# Patient Record
Sex: Female | Born: 1955
Health system: Southern US, Community
[De-identification: ages and names within clinical notes are randomized; demographics above are authoritative.]

## PROBLEM LIST (undated history)

## (undated) HISTORY — PX: ABDOMINAL HYSTERECTOMY: SHX81

---

## 2006-09-01 ENCOUNTER — Ambulatory Visit (HOSPITAL_COMMUNITY): Admission: RE | Admit: 2006-09-01 | Discharge: 2006-09-01 | Payer: Self-pay | Admitting: Family Medicine

## 2008-09-28 ENCOUNTER — Ambulatory Visit (HOSPITAL_COMMUNITY): Admission: RE | Admit: 2008-09-28 | Discharge: 2008-09-28 | Payer: Self-pay | Admitting: Family Medicine

## 2015-12-27 ENCOUNTER — Encounter: Payer: Self-pay | Admitting: Podiatry

## 2015-12-27 ENCOUNTER — Ambulatory Visit (INDEPENDENT_AMBULATORY_CARE_PROVIDER_SITE_OTHER): Payer: BLUE CROSS/BLUE SHIELD | Admitting: Podiatry

## 2015-12-27 VITALS — BP 131/70 | HR 89 | Resp 12

## 2015-12-27 DIAGNOSIS — L03032 Cellulitis of left toe: Secondary | ICD-10-CM | POA: Diagnosis not present

## 2015-12-27 MED ORDER — CEPHALEXIN 500 MG PO CAPS: 500.0000 mg | ORAL_CAPSULE | Freq: Four times a day (QID) | ORAL | Status: DC

## 2015-12-27 NOTE — Progress Notes (Signed)
Subjective:     Patient ID: Anna Sawyer, female   DOB: [birth year], 60 y.o.   MRN: [id number]  HPI this patient presents to the office with chief complaint of a painful big toenail, left foot. She says her toenail has been painful for the last 3 weeks it has become red, swollen and inflamed at the cuticle of her left big toe. She has attempted to soak it in Epsom salts, but no improvement was noted. She presents the office today for an evaluation and treatment of this condition Review of Systems     Objective:   Physical Exam GENERAL APPEARANCE: Alert, conversant. Appropriately groomed. No acute distress.  VASCULAR: Pedal pulses palpable at  [facility] and PT bilateral.  Capillary refill time is immediate to all digits,  Normal temperature gradient.  Digital hair growth is present bilateral  NEUROLOGIC: sensation is normal to 5.07 monofilament at 5/5 sites bilateral.  Light touch is intact bilateral, Muscle strength normal.  MUSCULOSKELETAL: acceptable muscle strength, tone and stability bilateral.  Intrinsic muscluature intact bilateral.  Rectus appearance of foot and digits noted bilateral.   DERMATOLOGIC: skin color, texture, and turgor are within normal limits.  No preulcerative lesions or ulcers  are seen, no interdigital maceration noted.  No open lesions present.  . No drainage noted.  NAILS  Redness and swelling and infection proximal nail fold left hallux.  Her toenail has evidence of injury to the proximal aspect of her left hallux nail. No abscess or fluctuance notefd.      Assessment:     Paronychia left hallux.     Plan:     IE  After evaluation of her hallux we decided to treat her conservatively with antibiotics and soaks.  If problem continues or worsens we will need to avulse the nail.  Prescribe cephalexin # 28 and perform home soaks.  RTC 1 week.    Gardiner Barefoot DPM

## 2016-01-03 ENCOUNTER — Encounter: Payer: Self-pay | Admitting: Podiatry

## 2016-01-03 ENCOUNTER — Ambulatory Visit (INDEPENDENT_AMBULATORY_CARE_PROVIDER_SITE_OTHER): Payer: BLUE CROSS/BLUE SHIELD | Admitting: Podiatry

## 2016-01-03 VITALS — BP 126/79 | HR 76 | Resp 14

## 2016-01-03 DIAGNOSIS — L03032 Cellulitis of left toe: Secondary | ICD-10-CM | POA: Diagnosis not present

## 2016-01-03 NOTE — Progress Notes (Signed)
Subjective:     Patient ID: Anna Sawyer, female   DOB: 25-Feb-1956, 60 y.o.   MRN: [id number]  HPI This patient returns to the office with diagnosis of paronychia left hallux.  There is persistant redness at proximal nail fold with mild discomfort.  She says her toe initially improved but then the redness returned.  She presents for continued evaluation and treatment.   Review of Systems     Objective:   Physical Examneurovascular status same as 12/27/15.  There is redness ap proximal nail fold left hallux.  The redness has lessened and the swelling has resolved.     Assessment:     Paronychia left hallux.     Plan:     Incision and Drainage right hallux toenail under local anesthesia with left hallux toenail avulsion.  Neosporin/DSD.  Give home instructions.  RTC 1 week. Nail to be sent to pathology.   Gardiner Barefoot DPM

## 2016-01-10 ENCOUNTER — Ambulatory Visit (INDEPENDENT_AMBULATORY_CARE_PROVIDER_SITE_OTHER): Payer: BLUE CROSS/BLUE SHIELD | Admitting: Podiatry

## 2016-01-10 ENCOUNTER — Encounter: Payer: Self-pay | Admitting: Podiatry

## 2016-01-10 VITALS — BP 128/70 | HR 74 | Resp 14

## 2016-01-10 DIAGNOSIS — Z09 Encounter for follow-up examination after completed treatment for conditions other than malignant neoplasm: Secondary | ICD-10-CM

## 2016-01-11 NOTE — Progress Notes (Signed)
Subjective:     Patient ID: RODNEISHA TAWATER, female   DOB: 1956-03-15, 60 y.o.   MRN: [id number]  HPIThis patient returns to the office following nail excision great toe left foot. She has history of infection at proximal nail fold.which was treated unsuccessfully with antibiotics.  She returns to the office today saying the toe is not painful but redness peisists at the cuticle.  She returns for continued evaluation and treatment.   Review of Systems     Objective:   Physical Exam GENERAL APPEARANCE: Alert, conversant. Appropriately groomed. No acute distress.  VASCULAR: Pedal pulses palpable at  [facility] and PT bilateral.  Capillary refill time is immediate to all digits,  Normal temperature gradient.  Digital hair growth is present bilateral  NEUROLOGIC: sensation is normal to 5.07 monofilament at 5/5 sites bilateral.  Light touch is intact bilateral, Muscle strength normal.  MUSCULOSKELETAL: acceptable muscle strength, tone and stability bilateral.  Intrinsic muscluature intact bilateral.  Rectus appearance of foot and digits noted bilateral.   DERMATOLOGIC: skin color, texture, and turgor are within normal limits.  No preulcerative lesions or ulcers  are seen, no interdigital maceration noted.  No open lesions present.  . No drainage noted. NAIL  Left hallyx nail bed has two ulcerated areas on proximal nail bed.  Persistant redness at proximal nail fold.  Redness is increased blood but not an infection.  No drainage from surgical site.      Assessment:     S/p nail surgery     Plan:     ROV.  Healing at surgical site.  We are concerned the nail has not healed and the patient was offered antibiotics.  She will call for prescription if needed.   Gardiner Barefoot DPM

## 2016-01-28 ENCOUNTER — Telehealth: Payer: Self-pay | Admitting: *Deleted

## 2016-01-28 NOTE — Telephone Encounter (Signed)
Pt states she had an ingrown removed and Dr. Prudence Davidson said if it worsened to call for an antibiotic.  Pt described the toe as painful, with swelling and stiffness in the 1st joint.  I instructed pt to begin epsom salt soaks and light antibiotic dressing until seen, and to make an appt.  Pt agreed and I transferred to schedulers.

## 2016-01-30 ENCOUNTER — Ambulatory Visit (INDEPENDENT_AMBULATORY_CARE_PROVIDER_SITE_OTHER): Payer: BLUE CROSS/BLUE SHIELD | Admitting: Podiatry

## 2016-01-30 ENCOUNTER — Encounter: Payer: Self-pay | Admitting: Podiatry

## 2016-01-30 VITALS — BP 125/78 | HR 69 | Resp 14

## 2016-01-30 DIAGNOSIS — Z09 Encounter for follow-up examination after completed treatment for conditions other than malignant neoplasm: Secondary | ICD-10-CM

## 2016-01-30 MED ORDER — DOXYCYCLINE HYCLATE 100 MG PO TABS: 100.0000 mg | ORAL_TABLET | Freq: Two times a day (BID) | ORAL | Status: DC

## 2016-01-30 NOTE — Progress Notes (Signed)
Subjective:     Patient ID: NIMAH ILL, female   DOB: [birth year], 60 y.o.   MRN: [id number]  HPIThis patient returns to the office following nail excision great toe left foot. She has history of infection at proximal nail fold.which was treated unsuccessfully with antibiotics.  She returns to the office today saying the toe is not painful but redness peisists at the cuticle.  She returns to the office with redness worsening with no swelling.  She is concerned an infection may be developing.   Review of Systems     Objective:   Physical Exam GENERAL APPEARANCE: Alert, conversant. Appropriately groomed. No acute distress.  VASCULAR: Pedal pulses palpable at  [facility] and PT bilateral.  Capillary refill time is immediate to all digits,  Normal temperature gradient.  Digital hair growth is present bilateral  NEUROLOGIC: sensation is normal to 5.07 monofilament at 5/5 sites bilateral.  Light touch is intact bilateral, Muscle strength normal.  MUSCULOSKELETAL: acceptable muscle strength, tone and stability bilateral.  Intrinsic muscluature intact bilateral.  Rectus appearance of foot and digits noted bilateral.   DERMATOLOGIC: skin color, texture, and turgor are within normal limits.  No preulcerative lesions or ulcers  are seen, no interdigital maceration noted.  No open lesions present.  . No drainage noted. NAIL  Left hallux nail bed has two ulcerated areas on proximal nail bed.  Persistant redness at proximal nail fold.  Redness is increased blood but not an infection.  No drainage from surgical site.      Assessment:     S/p nail surgery     Plan:     ROV.  Healing at surgical site.  We are concerned the nail has not healed and the patient prescribed doxycycline.     Gardiner Barefoot DPM

## 2016-02-13 ENCOUNTER — Other Ambulatory Visit: Payer: Self-pay | Admitting: *Deleted

## 2019-08-30 ENCOUNTER — Encounter (INDEPENDENT_AMBULATORY_CARE_PROVIDER_SITE_OTHER): Payer: Self-pay | Admitting: *Deleted

## 2021-04-01 ENCOUNTER — Other Ambulatory Visit (HOSPITAL_COMMUNITY): Payer: Self-pay | Admitting: Family Medicine

## 2021-04-01 DIAGNOSIS — Z122 Encounter for screening for malignant neoplasm of respiratory organs: Secondary | ICD-10-CM

## 2021-04-01 DIAGNOSIS — Z1231 Encounter for screening mammogram for malignant neoplasm of breast: Secondary | ICD-10-CM

## 2021-04-08 ENCOUNTER — Other Ambulatory Visit: Payer: Self-pay

## 2021-04-08 ENCOUNTER — Ambulatory Visit (HOSPITAL_COMMUNITY)
Admission: RE | Admit: 2021-04-08 | Discharge: 2021-04-08 | Disposition: A | Discharge status: Home / Self Care | Payer: Medicare Other | Source: Ambulatory Visit | Attending: Family Medicine | Admitting: Family Medicine

## 2021-04-08 DIAGNOSIS — Z1231 Encounter for screening mammogram for malignant neoplasm of breast: Secondary | ICD-10-CM | POA: Diagnosis present

## 2021-05-02 ENCOUNTER — Encounter: Payer: Self-pay | Admitting: *Deleted

## 2021-05-14 ENCOUNTER — Other Ambulatory Visit (HOSPITAL_COMMUNITY): Payer: Self-pay | Admitting: Family Medicine

## 2021-05-14 DIAGNOSIS — Z87891 Personal history of nicotine dependence: Secondary | ICD-10-CM

## 2021-05-14 DIAGNOSIS — Z122 Encounter for screening for malignant neoplasm of respiratory organs: Secondary | ICD-10-CM

## 2021-05-28 ENCOUNTER — Ambulatory Visit: Payer: Medicare Other

## 2021-06-03 ENCOUNTER — Ambulatory Visit (HOSPITAL_COMMUNITY)
Admission: RE | Admit: 2021-06-03 | Discharge: 2021-06-03 | Disposition: A | Discharge status: Home / Self Care | Payer: Medicare Other | Source: Ambulatory Visit | Attending: Family Medicine | Admitting: Family Medicine

## 2021-06-03 ENCOUNTER — Other Ambulatory Visit: Payer: Self-pay

## 2021-06-03 DIAGNOSIS — Z87891 Personal history of nicotine dependence: Secondary | ICD-10-CM | POA: Diagnosis present

## 2021-06-03 DIAGNOSIS — Z122 Encounter for screening for malignant neoplasm of respiratory organs: Secondary | ICD-10-CM | POA: Insufficient documentation

## 2021-06-10 HISTORY — PX: COLONOSCOPY: SHX174

## 2021-07-02 ENCOUNTER — Encounter: Payer: Self-pay | Admitting: Internal Medicine

## 2021-10-27 NOTE — Progress Notes (Signed)
Referring Provider: Dr. Adelina Mings Primary Care Physician:  Celene Squibb, MD Primary Gastroenterologist:  Dr. Abbey Chatters  Chief Complaint  Patient presents with   Colonoscopy    Never had done prior. Attempted to in [place]. Mother hx colon ca, siblings hx colon cancer    HPI:   Anna Sawyer is a 65 y.o. female presenting today at the request of Dr. Adelina Mings for colon adenoma.  Patient underwent colonoscopy with Dr. Adelina Mings on 06/10/2021 which revealed mild internal hemorrhoid, 8 mm polyp in the transverse colon removed with cold snare, 8 mm polyp at splenic flexure removed with hot snare and cold forceps.  States patient appeared to vomit at this point and the entire polyp may not have been resected, scope rapidly retrieved from splenic flexure so left-sided polyps may have been missed, diverticulosis in the left colon.  Both polyps removed were sessile serrated adenomas.  Recommended referral to GI for repeat endoscopy as colonoscopy was terminated prior to completion due to concern for potential emesis.  Today, patient reports she is doing well.  She is not having any GI problems.  Denies abdominal pain, change in bowel habits, BRBPR, melena, unintentional weight loss, nausea, vomiting, reflux symptoms, dysphagia.  She was not aware of any problems intraoperatively during her last colonoscopy.  She does state recover, her BP dropped.  History reviewed. No pertinent past medical history.  Past Surgical History:  Procedure Laterality Date   ABDOMINAL HYSTERECTOMY     COLONOSCOPY  06/10/2021   Dr. Rocco Serene; Mild internal hemorrhoid, 8 mm sessile serrated adenoma in transverse colon resected, 8 mm sessile serrated adenoma in the splenic flexure removed, unclear if entire polyp was removed as patient appeared to vomit and procedure was terminated, due to rapidly retrieving scope from splenic flexure, left-sided polyps may have been missed, diverticulosis in left colon.    No  current outpatient medications on file.   No current facility-administered medications for this visit.    Allergies as of 10/28/2021 - Review Complete 10/28/2021  Allergen Reaction Noted   Codeine  12/27/2015   Elemental sulfur Rash 12/27/2015    Family History  Problem Relation Age of Onset   Colon cancer Mother        late 28s   Colon polyps Sister    Colon polyps Sister    Colon polyps Brother    Colon polyps Brother    Colon polyps Brother     Social History   Socioeconomic History   Marital status: Married    Spouse name: Not on file   Number of children: Not on file   Years of education: Not on file   Highest education level: Not on file  Occupational History   Not on file  Tobacco Use   Smoking status: Former    Types: Cigarettes   Smokeless tobacco: Never  Substance and Sexual Activity   Alcohol use: No    Alcohol/week: 0.0 standard drinks   Drug use: No   Sexual activity: Not on file  Other Topics Concern   Not on file  Social History Narrative   Not on file   Social Determinants of Health   Financial Resource Strain: Not on file  Food Insecurity: Not on file  Transportation Needs: Not on file  Physical Activity: Not on file  Stress: Not on file  Social Connections: Not on file  Intimate Partner Violence: Not on file    Review of Systems: Gen: Denies any fever, chills, cold  or flulike symptoms, presyncope, syncope. CV: Denies chest pain.occasionally feels her heart may skip a beat, but nothing persistent.    Resp: Denies shortness of breath or cough. GI: See HPI GU : Denies urinary burning, urinary frequency, urinary hesitancy MS: Denies joint pain Derm: Denies rash Psych: Denies depression, anxiety Heme: See HPI  Physical Exam: BP (!) 161/81   Pulse 65   Temp (!) 97.1 F (36.2 C)   Ht 5\' 5"  (1.651 m)   Wt 160 lb 12.8 oz (72.9 kg)   BMI 26.76 kg/m  General:   Alert and oriented. Pleasant and cooperative. Well-nourished and  well-developed.  Head:  Normocephalic and atraumatic. Eyes:  Without icterus, sclera clear and conjunctiva pink.  Ears:  Normal auditory acuity. Lungs:  Clear to auscultation bilaterally. No wheezes, rales, or rhonchi. No distress.  Heart:  S1, S2 present without murmurs appreciated.  Abdomen:  +BS, soft, non-tender and non-distended. No HSM noted. No guarding or rebound. No masses appreciated.  Rectal:  Deferred  Msk:  Symmetrical without gross deformities. Normal posture. Extremities:  Without edema. Neurologic:  Alert and  oriented x4;  grossly normal neurologically. Skin:  Intact without significant lesions or rashes. Psych:  Normal mood and affect.    Assessment: 65 year old female with history of colon polyps, presenting today to discuss scheduling colonoscopy due to recent incomplete colonoscopy in July 2022 performed by Dr. Rocco Serene.  She was found to have mild internal hemorrhoids, 8 mm sessile serrated adenoma in the transverse colon resected, 8 mm sessile serrated adenoma at the splenic flexure removed, but unclear if polyp was completely resected as patient appeared to vomit during polypectomy and procedure was terminated.  States scope was rapidly retrieved from splenic flexure so left-sided polyps may have also been missed.  She was referred to GI for complete colonoscopy.  She has no significant upper or lower GI symptoms.  No alarm symptoms.  Family history significant for mother with colon cancer in her late 67s and 5 siblings with colon polyps.   Plan: I will discuss her prior anesthesia complications with Dr. Abbey Chatters.  Do not anticipate any issues with getting her scheduled for repeat colonoscopy. Follow-up PRN    Nevin Bloodgood [facility] 10/28/2021

## 2021-10-28 ENCOUNTER — Encounter: Payer: Self-pay | Admitting: Gastroenterology

## 2021-10-28 ENCOUNTER — Other Ambulatory Visit: Payer: Self-pay

## 2021-10-28 ENCOUNTER — Ambulatory Visit (INDEPENDENT_AMBULATORY_CARE_PROVIDER_SITE_OTHER): Payer: Medicare Other | Admitting: Gastroenterology

## 2021-10-28 VITALS — BP 161/81 | HR 65 | Temp 97.1°F | Ht 65.0 in | Wt 160.8 lb

## 2021-10-28 DIAGNOSIS — Z86010 Personal history of colonic polyps: Secondary | ICD-10-CM | POA: Diagnosis not present

## 2021-10-28 DIAGNOSIS — T8859XA Other complications of anesthesia, initial encounter: Secondary | ICD-10-CM

## 2021-10-28 NOTE — Patient Instructions (Signed)
I am going to discuss with your prior anesthesia complications with Dr. Abbey Chatters prior to scheduling your colonoscopy.  You should hear back from our office early next week.  If not, please reach back out to [place].  It was great meeting you today!  I hope you have a very Merry Christmas!  Aliene Altes, PA-C [facility]

## 2021-10-30 ENCOUNTER — Encounter: Payer: Self-pay | Admitting: Gastroenterology

## 2021-10-30 DIAGNOSIS — T8859XA Other complications of anesthesia, initial encounter: Secondary | ICD-10-CM | POA: Insufficient documentation

## 2021-10-30 DIAGNOSIS — Z86010 Personal history of colonic polyps: Secondary | ICD-10-CM | POA: Insufficient documentation

## 2021-12-30 ENCOUNTER — Telehealth: Payer: Self-pay | Admitting: Internal Medicine

## 2021-12-30 NOTE — Telephone Encounter (Signed)
Patient called and asked when she will be scheduled for her procedure

## 2021-12-30 NOTE — Telephone Encounter (Signed)
We don't have any orders on pt.

## 2021-12-30 NOTE — Telephone Encounter (Signed)
I apologize, I though I sent a message previously about getting this patient scheduled.   OK to arrange colonoscopy with propofol with Dr. Abbey Chatters.  Dx: History of colon polyps, family history of colon cancer, family history of colon polyps ASA II Please make note- patient had intraoperative nausea and vomiting during last colonoscopy.

## 2021-12-30 NOTE — Telephone Encounter (Signed)
LMTCB

## 2021-12-31 NOTE — Telephone Encounter (Signed)
LMOVM for pt 

## 2022-01-01 NOTE — Telephone Encounter (Signed)
Please hold off on scheduling colonoscopy for now. Would like to see her in the office to follow-up in 4 weeks to ensure she is doing well prior to scheduling. Ideally, we would perform colonoscopy 6+ weeks after diagnosis. We do not want to perform a colonoscopy at the time of active diverticulitis due to increased risk of colon perforation.   She can be scheduled with any APP or Dr. Abbey Chatters. Please note, Apps will have appointments opening up at the end of February/early March, so we should be able to get her in no problem.

## 2022-01-01 NOTE — Telephone Encounter (Signed)
Called pt. She is aware of Kristen's response. I have also scheduled her for follow up appt with Dr. Abbey Chatters.

## 2022-01-01 NOTE — Telephone Encounter (Signed)
Patient returned call. She states she had to go to the doctor on Saturday and was treated for diverticulitis. Currently on cipro and flagyl. No CT was done. She states they told her it could be confirmed with a colonoscopy. Please advise Cyril Mourning if okay to still proceed or how far should we schedule her. Thanks!

## 2022-01-01 NOTE — Telephone Encounter (Signed)
Letter mailed

## 2022-02-05 ENCOUNTER — Ambulatory Visit (INDEPENDENT_AMBULATORY_CARE_PROVIDER_SITE_OTHER): Payer: Medicare Other | Admitting: Internal Medicine

## 2022-02-05 ENCOUNTER — Other Ambulatory Visit: Payer: Self-pay

## 2022-02-05 ENCOUNTER — Encounter: Payer: Self-pay | Admitting: Internal Medicine

## 2022-02-05 VITALS — BP 140/80 | HR 71 | Temp 96.9°F | Ht 64.0 in | Wt 156.2 lb

## 2022-02-05 DIAGNOSIS — K5792 Diverticulitis of intestine, part unspecified, without perforation or abscess without bleeding: Secondary | ICD-10-CM

## 2022-02-05 DIAGNOSIS — T8859XA Other complications of anesthesia, initial encounter: Secondary | ICD-10-CM

## 2022-02-05 DIAGNOSIS — D126 Benign neoplasm of colon, unspecified: Secondary | ICD-10-CM | POA: Diagnosis not present

## 2022-02-05 NOTE — Progress Notes (Signed)
? ? ?Referring Provider: Celene Squibb, MD ?Primary Care Physician:  Celene Squibb, MD ?Primary GI:  Dr. Abbey Chatters ? ?Chief Complaint  ?Patient presents with  ? Colonoscopy  ? ? ?HPI:   ?Anna Sawyer is a 66 y.o. female who presents to clinic today for follow-up visit.  Previously seen December 2022. ? ?Patient underwent colonoscopy with Dr. Adelina Mings on 06/10/2021 which revealed mild internal hemorrhoid, 8 mm polyp in the transverse colon removed with cold snare, 8 mm polyp at splenic flexure removed with hot snare and cold forceps.  States patient appeared to vomit at this point and the entire polyp may not have been resected, scope rapidly retrieved from splenic flexure so left-sided polyps may have been missed, diverticulosis in the left colon.  Both polyps removed were sessile serrated adenomas.  Recommended referral to GI for repeat endoscopy as colonoscopy was terminated prior to completion due to concern for potential emesis. ? ?She was scheduled for colonoscopy but in early February she was diagnosed with diverticulitis.  Notes sudden onset abdominal pain.  She was treated with ciprofloxacin and metronidazole and her symptoms resolved.  Today she states she is doing well.  No further abdominal pain.  No melena hematochezia. ? ?No past medical history on file. ? ?Past Surgical History:  ?Procedure Laterality Date  ? ABDOMINAL HYSTERECTOMY    ? COLONOSCOPY  06/10/2021  ? Dr. Rocco Serene; Mild internal hemorrhoid, 8 mm sessile serrated adenoma in transverse colon resected, 8 mm sessile serrated adenoma in the splenic flexure removed, unclear if entire polyp was removed as patient appeared to vomit and procedure was terminated, due to rapidly retrieving scope from splenic flexure, left-sided polyps may have been missed, diverticulosis in left colon.  ? ? ?No current outpatient medications on file.  ? ?No current facility-administered medications for this visit.  ? ? ?Allergies as of 02/05/2022 - Review Complete  02/05/2022  ?Allergen Reaction Noted  ? Codeine  12/27/2015  ? Elemental sulfur Rash 12/27/2015  ? ? ?Family History  ?Problem Relation Age of Onset  ? Colon cancer Mother   ?     late 48s  ? Colon polyps Sister   ? Colon polyps Sister   ? Colon polyps Brother   ? Colon polyps Brother   ? Colon polyps Brother   ? ? ?Social History  ? ?Socioeconomic History  ? Marital status: Married  ?  Spouse name: Not on file  ? Number of children: Not on file  ? Years of education: Not on file  ? Highest education level: Not on file  ?Occupational History  ? Not on file  ?Tobacco Use  ? Smoking status: Former  ?  Types: Cigarettes  ? Smokeless tobacco: Never  ?Substance and Sexual Activity  ? Alcohol use: No  ?  Alcohol/week: 0.0 standard drinks  ? Drug use: No  ? Sexual activity: Yes  ?  Partners: Male  ?  Comment: spouse  ?Other Topics Concern  ? Not on file  ?Social History Narrative  ? Not on file  ? ?Social Determinants of Health  ? ?Financial Resource Strain: Not on file  ?Food Insecurity: Not on file  ?Transportation Needs: Not on file  ?Physical Activity: Not on file  ?Stress: Not on file  ?Social Connections: Not on file  ? ? ?Subjective: ?Review of Systems  ?Constitutional:  Negative for chills and fever.  ?HENT:  Negative for congestion and hearing loss.   ?Eyes:  Negative for blurred vision and  double vision.  ?Respiratory:  Negative for cough and shortness of breath.   ?Cardiovascular:  Negative for chest pain and palpitations.  ?Gastrointestinal:  Negative for abdominal pain, blood in stool, constipation, diarrhea, heartburn, melena and vomiting.  ?Genitourinary:  Negative for dysuria and urgency.  ?Musculoskeletal:  Negative for joint pain and myalgias.  ?Skin:  Negative for itching and rash.  ?Neurological:  Negative for dizziness and headaches.  ?Psychiatric/Behavioral:  Negative for depression. The patient is not nervous/anxious.   ? ? ?Objective: ?BP 140/80 (BP Location: Right Arm, Patient Position: Sitting,  Cuff Size: Normal)   Pulse 71   Temp (!) 96.9 ?F (36.1 ?C) (Temporal)   Ht '5\' 4"'$  (1.626 m)   Wt 156 lb 3.2 oz (70.9 kg)   SpO2 100%   BMI 26.81 kg/m?  ?Physical Exam ?Constitutional:   ?   Appearance: Normal appearance.  ?HENT:  ?   Head: Normocephalic and atraumatic.  ?Eyes:  ?   Extraocular Movements: Extraocular movements intact.  ?   Conjunctiva/sclera: Conjunctivae normal.  ?Cardiovascular:  ?   Rate and Rhythm: Normal rate and regular rhythm.  ?Pulmonary:  ?   Effort: Pulmonary effort is normal.  ?   Breath sounds: Normal breath sounds.  ?Abdominal:  ?   General: Bowel sounds are normal.  ?   Palpations: Abdomen is soft.  ?Musculoskeletal:     ?   General: No swelling. Normal range of motion.  ?   Cervical back: Normal range of motion and neck supple.  ?Skin: ?   General: Skin is warm and dry.  ?   Coloration: Skin is not jaundiced.  ?Neurological:  ?   General: No focal deficit present.  ?   Mental Status: She is alert and oriented to person, place, and time.  ?Psychiatric:     ?   Mood and Affect: Mood normal.     ?   Behavior: Behavior normal.  ? ? ? ?Assessment: ?*Adenomatous colon polyps ?*Diverticulitis-resolved ? ?Plan: ?Will reschedule patient for colonoscopy.The risks including infection, bleed, or perforation as well as benefits, limitations, alternatives and imponderables have been reviewed with the patient. Questions have been answered. All parties agreeable. ? ? ?02/05/2022 1:28 PM ? ? ?Disclaimer: This note was dictated with voice recognition software. Similar sounding words can inadvertently be transcribed and may not be corrected upon review. ? ?

## 2022-02-05 NOTE — Patient Instructions (Signed)
We will get you rescheduled for colonoscopy given incomplete procedure prior. ? ?I am happy to hear that you are feeling better from your recent bout of diverticulitis. ? ?It was very nice meeting you today. ? ?Dr. Abbey Chatters ? ?At [facility] we value your feedback. You may receive a survey about your visit today. Please share your experience as we strive to create trusting relationships with our patients to provide genuine, compassionate, quality care. ? ?We appreciate your understanding and patience as we review any laboratory studies, imaging, and other diagnostic tests that are ordered as we care for you. Our office policy is 5 business days for review of these results, and any emergent or urgent results are addressed in a timely manner for your best interest. If you do not hear from our office in 1 week, please contact us.  ? ?We also encourage the use of MyChart, which contains your medical information for your review as well. If you are not enrolled in this feature, an access code is on this after visit summary for your convenience. Thank you for allowing [place] to be involved in your care. ? ?It was great to see you today!  I hope you have a great rest of your Winter! ? ? ? ?Anna Sawyer. Abbey Chatters, D.O. ?Gastroenterology and Hepatology ?[facility] ? ?

## 2022-02-07 ENCOUNTER — Telehealth: Payer: Self-pay | Admitting: *Deleted

## 2022-02-07 MED ORDER — PEG 3350-KCL-NA BICARB-NACL 420 G PO SOLR: ORAL | 0 refills | Status: DC

## 2022-02-07 NOTE — Telephone Encounter (Signed)
Called pt. Colonoscopy with propofol asa 2 scheduled for 4/21 at 10:15am. Aware will mail instructions and send rx to pharmacy. ?

## 2022-03-10 ENCOUNTER — Telehealth: Payer: Self-pay | Admitting: Internal Medicine

## 2022-03-10 NOTE — Telephone Encounter (Signed)
Called pt. Aware pre service center will call with costs for procedure. ?

## 2022-03-10 NOTE — Telephone Encounter (Signed)
Pt is scheduled procedure with Dr Margaree Mackintosh this Friday 03/14/2022 and has questions about her insurance covering her procedure since she had one last year. Please advise and call her back at [phone] ?

## 2022-03-14 ENCOUNTER — Ambulatory Visit (HOSPITAL_COMMUNITY)
Admission: RE | Admit: 2022-03-14 | Discharge: 2022-03-14 | Disposition: A | Discharge status: Home / Self Care | Payer: Medicare Other | Attending: Internal Medicine | Admitting: Internal Medicine

## 2022-03-14 ENCOUNTER — Ambulatory Visit (HOSPITAL_BASED_OUTPATIENT_CLINIC_OR_DEPARTMENT_OTHER): Payer: Medicare Other | Admitting: Certified Registered"

## 2022-03-14 ENCOUNTER — Encounter (HOSPITAL_COMMUNITY)
Admission: RE | Disposition: A | Discharge status: Home / Self Care | Payer: Self-pay | Source: Home / Self Care | Attending: Internal Medicine

## 2022-03-14 ENCOUNTER — Encounter (HOSPITAL_COMMUNITY): Payer: Self-pay

## 2022-03-14 ENCOUNTER — Ambulatory Visit (HOSPITAL_COMMUNITY): Payer: Medicare Other | Admitting: Certified Registered"

## 2022-03-14 ENCOUNTER — Other Ambulatory Visit: Payer: Self-pay

## 2022-03-14 DIAGNOSIS — Z86010 Personal history of colonic polyps: Secondary | ICD-10-CM | POA: Insufficient documentation

## 2022-03-14 DIAGNOSIS — Z87891 Personal history of nicotine dependence: Secondary | ICD-10-CM | POA: Insufficient documentation

## 2022-03-14 DIAGNOSIS — D124 Benign neoplasm of descending colon: Secondary | ICD-10-CM | POA: Diagnosis not present

## 2022-03-14 DIAGNOSIS — K5730 Diverticulosis of large intestine without perforation or abscess without bleeding: Secondary | ICD-10-CM

## 2022-03-14 DIAGNOSIS — K635 Polyp of colon: Secondary | ICD-10-CM | POA: Diagnosis not present

## 2022-03-14 DIAGNOSIS — K648 Other hemorrhoids: Secondary | ICD-10-CM | POA: Diagnosis not present

## 2022-03-14 DIAGNOSIS — D122 Benign neoplasm of ascending colon: Secondary | ICD-10-CM

## 2022-03-14 HISTORY — PX: BIOPSY: SHX5522

## 2022-03-14 HISTORY — PX: POLYPECTOMY: SHX5525

## 2022-03-14 HISTORY — PX: COLONOSCOPY WITH PROPOFOL: SHX5780

## 2022-03-14 SURGERY — COLONOSCOPY WITH PROPOFOL
Anesthesia: General

## 2022-03-14 MED ORDER — LACTATED RINGERS IV SOLN: INTRAVENOUS | Status: DC

## 2022-03-14 MED ORDER — LIDOCAINE HCL (CARDIAC) PF 100 MG/5ML IV SOSY
PREFILLED_SYRINGE | INTRAVENOUS | Status: DC | PRN
  Administered 2022-03-14: 50 mg via INTRAVENOUS

## 2022-03-14 MED ORDER — PROPOFOL 10 MG/ML IV BOLUS
INTRAVENOUS | Status: DC | PRN
  Administered 2022-03-14: 40 mg via INTRAVENOUS
  Administered 2022-03-14: 120 mg via INTRAVENOUS
  Administered 2022-03-14: 40 mg via INTRAVENOUS
  Administered 2022-03-14: 30 mg via INTRAVENOUS

## 2022-03-14 MED ORDER — LACTATED RINGERS IV SOLN: INTRAVENOUS | Status: DC | PRN

## 2022-03-14 NOTE — Op Note (Signed)
[facility] ?Patient Name: Anna Sawyer ?Procedure Date: 03/14/2022 9:32 AM ?MRN: [id number] ?Date of Birth: 1956-02-26 ?Attending MD: Elon Alas. Abbey Chatters , DO ?CSN: [id number] ?Age: 66 ?Admit Type: Outpatient ?Procedure:                Colonoscopy ?Indications:              High risk colon cancer surveillance: Personal  ?                          history of colonic polyps ?Providers:                Elon Alas. Abbey Chatters, DO, Janeece Riggers, RN, Crisann  ?                          Wynonia Lawman, Technician ?Referring MD:              ?Medicines:                See the Anesthesia note for documentation of the  ?                          administered medications ?Complications:            No immediate complications. ?Estimated Blood Loss:     Estimated blood loss was minimal. ?Procedure:                Pre-Anesthesia Assessment: ?                          - The anesthesia plan was to use monitored  ?                          anesthesia care (MAC). ?                          After obtaining informed consent, the colonoscope  ?                          was passed under direct vision. Throughout the  ?                          procedure, the patient's blood pressure, pulse, and  ?                          oxygen saturations were monitored continuously. The  ?                          PCF-HQ190L ([id number]) scope was introduced through  ?                          the anus and advanced to the the cecum, identified  ?                          by appendiceal orifice and ileocecal valve. The  ?                          colonoscopy was performed without difficulty. The  ?  patient tolerated the procedure well. The quality  ?                          of the bowel preparation was evaluated using the  ?                          BBPS [facility] Bowel Preparation Scale) with scores  ?                          of: Right Colon = 3, Transverse Colon = 3 and Left  ?                          Colon = 3 (entire mucosa seen well with  no residual  ?                          staining, small fragments of stool or opaque  ?                          liquid). The total BBPS score equals 9. ?Scope In: 9:44:29 AM ?Scope Out: 9:57:57 AM ?Scope Withdrawal Time: 0 hours 8 minutes 52 seconds  ?Total Procedure Duration: 0 hours 13 minutes 28 seconds  ?Findings: ?     The perianal and digital rectal examinations were normal. ?     Non-bleeding internal hemorrhoids were found during endoscopy. ?     Multiple small and large-mouthed diverticula were found in the sigmoid  ?     colon and descending colon. ?     A 2 mm polyp was found in the ascending colon. The polyp was sessile.  ?     The polyp was removed with a cold biopsy forceps. Resection and  ?     retrieval were complete. ?     A 2 mm polyp was found in the descending colon. The polyp was sessile.  ?     The polyp was removed with a cold biopsy forceps. Resection and  ?     retrieval were complete. ?     A 5 mm polyp was found in the descending colon. The polyp was sessile.  ?     The polyp was removed with a cold snare. Resection and retrieval were  ?     complete. ?     The exam was otherwise without abnormality. ?Impression:               - Non-bleeding internal hemorrhoids. ?                          - Diverticulosis in the sigmoid colon and in the  ?                          descending colon. ?                          - One 2 mm polyp in the ascending colon, removed  ?                          with a cold biopsy forceps. Resected and retrieved. ?                          -  One 2 mm polyp in the descending colon, removed  ?                          with a cold biopsy forceps. Resected and retrieved. ?                          - One 5 mm polyp in the descending colon, removed  ?                          with a cold snare. Resected and retrieved. ?                          - The examination was otherwise normal. ?Moderate Sedation: ?     Per Anesthesia Care ?Recommendation:           - Patient has a  contact number available for  ?                          emergencies. The signs and symptoms of potential  ?                          delayed complications were discussed with the  ?                          patient. Return to normal activities tomorrow.  ?                          Written discharge instructions were provided to the  ?                          patient. ?                          - Resume previous diet. ?                          - Continue present medications. ?                          - Await pathology results. ?                          - Repeat colonoscopy in 5 years for surveillance  ?                          and history of multiple polyps prior ?                          - Return to GI clinic PRN. ?                          - Use fiber, for example Citrucel, Fibercon, Konsyl  ?                          or Metamucil. ?Procedure Code(s):        ---  Professional --- ?                          [phone], Colonoscopy, flexible; with removal of  ?                          tumor(s), polyp(s), or other lesion(s) by snare  ?                          technique ?                          45380, 59, Colonoscopy, flexible; with biopsy,  ?                          single or multiple ?Diagnosis Code(s):        --- Professional --- ?                          Z86.010, Personal history of colonic polyps ?                          K64.8, Other hemorrhoids ?                          K63.5, Polyp of colon ?                          K57.30, Diverticulosis of large intestine without  ?                          perforation or abscess without bleeding ?CPT copyright 2019 American Medical Association. All rights reserved. ?The codes documented in this report are preliminary and upon coder review may  ?be revised to meet current compliance requirements. ?Elon Alas. Abbey Chatters, DO ?Elon Alas. Greene, DO ?03/14/2022 10:01:30 AM ?This report has been signed electronically. ?Number of Addenda: 0 ?

## 2022-03-14 NOTE — Anesthesia Preprocedure Evaluation (Signed)
Anesthesia Evaluation  ?Patient identified by MRN, date of birth, ID band ?Patient awake ? ? ? ?Reviewed: ?Allergy & Precautions, H&P , NPO status , Patient's Chart, lab work & pertinent test results, reviewed documented beta blocker date and time  ? ?Airway ?Mallampati: II ? ?TM Distance: >3 FB ?Neck ROM: full ? ? ? Dental ?no notable dental hx. ? ?  ?Pulmonary ?neg pulmonary ROS, former smoker,  ?  ?Pulmonary exam normal ?breath sounds clear to auscultation ? ? ? ? ? ? Cardiovascular ?Exercise Tolerance: Good ?negative cardio ROS ? ? ?Rhythm:regular Rate:Normal ? ? ?  ?Neuro/Psych ?negative neurological ROS ? negative psych ROS  ? GI/Hepatic ?negative GI ROS, Neg liver ROS,   ?Endo/Other  ?negative endocrine ROS ? Renal/GU ?negative Renal ROS  ?negative genitourinary ?  ?Musculoskeletal ? ? Abdominal ?  ?Peds ? Hematology ?negative hematology ROS ?(+)   ?Anesthesia Other Findings ? ? Reproductive/Obstetrics ?negative OB ROS ? ?  ? ? ? ? ? ? ? ? ? ? ? ? ? ?  ?  ? ? ? ? ? ? ? ? ?Anesthesia Physical ?Anesthesia Plan ? ?ASA: 1 ? ?Anesthesia Plan: General  ? ?Post-op Pain Management:   ? ?Induction:  ? ?PONV Risk Score and Plan: Propofol infusion ? ?Airway Management Planned:  ? ?Additional Equipment:  ? ?Intra-op Plan:  ? ?Post-operative Plan:  ? ?Informed Consent: I have reviewed the patients History and Physical, chart, labs and discussed the procedure including the risks, benefits and alternatives for the proposed anesthesia with the patient or authorized representative who has indicated his/her understanding and acceptance.  ? ? ? ?Dental Advisory Given ? ?Plan Discussed with: CRNA ? ?Anesthesia Plan Comments:   ? ? ? ? ? ? ?Anesthesia Quick Evaluation ? ?

## 2022-03-14 NOTE — Discharge Instructions (Addendum)
?  Colonoscopy Discharge Instructions  Read the instructions outlined below and refer to this sheet in the next few weeks. These discharge instructions provide you with general information on caring for yourself after you leave the hospital. Your doctor may also give you specific instructions. While your treatment has been planned according to the most current medical practices available, unavoidable complications occasionally occur.   ACTIVITY You may resume your regular activity, but move at a slower pace for the next 24 hours.  Take frequent rest periods for the next 24 hours.  Walking will help get rid of the air and reduce the bloated feeling in your belly (abdomen).  No driving for 24 hours (because of the medicine (anesthesia) used during the test).   Do not sign any important legal documents or operate any machinery for 24 hours (because of the anesthesia used during the test).  NUTRITION Drink plenty of fluids.  You may resume your normal diet as instructed by your doctor.  Begin with a light meal and progress to your normal diet. Heavy or fried foods are harder to digest and may make you feel sick to your stomach (nauseated).  Avoid alcoholic beverages for 24 hours or as instructed.  MEDICATIONS You may resume your normal medications unless your doctor tells you otherwise.  WHAT YOU CAN EXPECT TODAY Some feelings of bloating in the abdomen.  Passage of more gas than usual.  Spotting of blood in your stool or on the toilet paper.  IF YOU HAD POLYPS REMOVED DURING THE COLONOSCOPY: No aspirin products for 7 days or as instructed.  No alcohol for 7 days or as instructed.  Eat a soft diet for the next 24 hours.  FINDING OUT THE RESULTS OF YOUR TEST Not all test results are available during your visit. If your test results are not back during the visit, make an appointment with your caregiver to find out the results. Do not assume everything is normal if you have not heard from your  caregiver or the medical facility. It is important for you to follow up on all of your test results.  SEEK IMMEDIATE MEDICAL ATTENTION IF: You have more than a spotting of blood in your stool.  Your belly is swollen (abdominal distention).  You are nauseated or vomiting.  You have a temperature over 101.  You have abdominal pain or discomfort that is severe or gets worse throughout the day.   Your colonoscopy revealed 3 polyp(s) which I removed successfully. Await pathology results, my office will contact you. I recommend repeating colonoscopy in 5 years for surveillance purposes.   You also have diverticulosis and internal hemorrhoids. I would recommend increasing fiber in your diet or adding OTC Benefiber/Metamucil. Be sure to drink at least 4 to 6 glasses of water daily. Follow-up with GI as needed.   I hope you have a great rest of your week!  Charles K. Carver, D.O. Gastroenterology and Hepatology [facility]  

## 2022-03-14 NOTE — Anesthesia Postprocedure Evaluation (Signed)
Anesthesia Post Note ? ?Patient: Anna Sawyer ? ?Procedure(s) Performed: COLONOSCOPY WITH PROPOFOL ?BIOPSY ?POLYPECTOMY ? ?Patient location during evaluation: Phase II ?Anesthesia Type: General ?Level of consciousness: awake ?Pain management: pain level controlled ?Vital Signs Assessment: post-procedure vital signs reviewed and stable ?Respiratory status: spontaneous breathing and respiratory function stable ?Cardiovascular status: blood pressure returned to baseline and stable ?Postop Assessment: no headache and no apparent nausea or vomiting ?Anesthetic complications: no ?Comments: Late entry ? ? ?No notable events documented. ? ? ?Last Vitals:  ?Vitals:  ? 03/14/22 0904 03/14/22 1000  ?BP: 127/78 108/61  ?Pulse: 68 72  ?Resp: 10 16  ?Temp: 36.5 ?C (!) 36.4 ?C  ?SpO2: 96% 96%  ?  ?Last Pain:  ?Vitals:  ? 03/14/22 1003  ?TempSrc:   ?PainSc: 0-No pain  ? ? ?  ?  ?  ?  ?  ?  ? ?Louann Sjogren ? ? ? ? ?

## 2022-03-14 NOTE — Transfer of Care (Signed)
Immediate Anesthesia Transfer of Care Note ? ?Patient: Anna Sawyer ? ?Procedure(s) Performed: COLONOSCOPY WITH PROPOFOL ?BIOPSY ?POLYPECTOMY ? ?Patient Location: Endoscopy Unit ? ?Anesthesia Type:General ? ?Level of Consciousness: awake ? ?Airway & Oxygen Therapy: Patient Spontanous Breathing ? ?Post-op Assessment: Report given to RN and Post -op Vital signs reviewed and stable ? ?Post vital signs: Reviewed and stable ? ?Last Vitals:  ?Vitals Value Taken Time  ?BP    ?Temp    ?Pulse 71   ?Resp 15   ?SpO2 95%   ? ? ?Last Pain:  ?Vitals:  ? 03/14/22 0932  ?TempSrc:   ?PainSc: 0-No pain  ?   ? ?Patients Stated Pain Goal: 8 (03/14/22 1751) ? ?Complications: No notable events documented. ?

## 2022-03-14 NOTE — H&P (Signed)
Primary Care Physician:  Celene Squibb, MD ?Primary Gastroenterologist:  Dr. Abbey Chatters ? ?Pre-Procedure History & Physical: ?HPI:  Anna Sawyer is a 66 y.o. female is here for a colonoscopy for personal history of polyps. Patient underwent colonoscopy with Dr. Adelina Mings on 06/10/2021 which revealed mild internal hemorrhoid, 8 mm polyp in the transverse colon removed with cold snare, 8 mm polyp at splenic flexure removed with hot snare and cold forceps.  States patient appeared to vomit at this point and the entire polyp may not have been resected, scope rapidly retrieved from splenic flexure so left-sided polyps may have been missed, diverticulosis in the left colon.  Both polyps removed were sessile serrated adenomas.  Recommended referral to GI for repeat endoscopy as colonoscopy was terminated prior to completion due to concern for potential emesis. ?  ?She was scheduled for colonoscopy but in early February she was diagnosed with diverticulitis.  Notes sudden onset abdominal pain.  She was treated with ciprofloxacin and metronidazole and her symptoms resolved.  Today she states she is doing well.  No further abdominal pain.  No melena hematochezia. ? ?History reviewed. No pertinent past medical history. ? ?Past Surgical History:  ?Procedure Laterality Date  ? ABDOMINAL HYSTERECTOMY    ? COLONOSCOPY  06/10/2021  ? Dr. Rocco Serene; Mild internal hemorrhoid, 8 mm sessile serrated adenoma in transverse colon resected, 8 mm sessile serrated adenoma in the splenic flexure removed, unclear if entire polyp was removed as patient appeared to vomit and procedure was terminated, due to rapidly retrieving scope from splenic flexure, left-sided polyps may have been missed, diverticulosis in left colon.  ? ? ?Prior to Admission medications   ?Medication Sig Start Date End Date Taking? Authorizing Provider  ?polyethylene glycol-electrolytes (NULYTELY) 420 g solution As directed 02/07/22  Yes Eloise Harman, DO  ? ? ?Allergies  as of 02/07/2022 - Review Complete 02/05/2022  ?Allergen Reaction Noted  ? Codeine  12/27/2015  ? Elemental sulfur Rash 12/27/2015  ? ? ?Family History  ?Problem Relation Age of Onset  ? Colon cancer Mother   ?     late 53s  ? Colon polyps Sister   ? Colon polyps Sister   ? Colon polyps Brother   ? Colon polyps Brother   ? Colon polyps Brother   ? ? ?Social History  ? ?Socioeconomic History  ? Marital status: Married  ?  Spouse name: Not on file  ? Number of children: Not on file  ? Years of education: Not on file  ? Highest education level: Not on file  ?Occupational History  ? Not on file  ?Tobacco Use  ? Smoking status: Former  ?  Types: Cigarettes  ? Smokeless tobacco: Never  ?Substance and Sexual Activity  ? Alcohol use: No  ?  Alcohol/week: 0.0 standard drinks  ? Drug use: No  ? Sexual activity: Yes  ?  Partners: Male  ?  Comment: spouse  ?Other Topics Concern  ? Not on file  ?Social History Narrative  ? Not on file  ? ?Social Determinants of Health  ? ?Financial Resource Strain: Not on file  ?Food Insecurity: Not on file  ?Transportation Needs: Not on file  ?Physical Activity: Not on file  ?Stress: Not on file  ?Social Connections: Not on file  ?Intimate Partner Violence: Not on file  ? ? ?Review of Systems: ?See HPI, otherwise negative ROS ? ?Physical Exam: ?Vital signs in last 24 hours: ?Temp:  [97.7 ?F (36.5 ?C)] 97.7 ?F (36.5 ?  C) (04/21 0488) ?Pulse Rate:  [68] 68 (04/21 0904) ?Resp:  [10] 10 (04/21 0904) ?BP: (127)/(78) 127/78 (04/21 0904) ?SpO2:  [96 %] 96 % (04/21 0904) ?Weight:  [70.3 kg] 70.3 kg (04/21 0904) ?  ?General:   Alert,  Well-developed, well-nourished, pleasant and cooperative in NAD ?Head:  Normocephalic and atraumatic. ?Eyes:  Sclera clear, no icterus.   Conjunctiva pink. ?Ears:  Normal auditory acuity. ?Nose:  No deformity, discharge,  or lesions. ?Mouth:  No deformity or lesions, dentition normal. ?Neck:  Supple; no masses or thyromegaly. ?Lungs:  Clear throughout to auscultation.   No  wheezes, crackles, or rhonchi. No acute distress. ?Heart:  Regular rate and rhythm; no murmurs, clicks, rubs,  or gallops. ?Abdomen:  Soft, nontender and nondistended. No masses, hepatosplenomegaly or hernias noted. Normal bowel sounds, without guarding, and without rebound.   ?Msk:  Symmetrical without gross deformities. Normal posture. ?Extremities:  Without clubbing or edema. ?Neurologic:  Alert and  oriented x4;  grossly normal neurologically. ?Skin:  Intact without significant lesions or rashes. ?Cervical Nodes:  No significant cervical adenopathy. ?Psych:  Alert and cooperative. Normal mood and affect. ? ?Impression/Plan: ?Anna Sawyer is here for a colonoscopy to be performed for history of polyps.  ? ?The risks of the procedure including infection, bleed, or perforation as well as benefits, limitations, alternatives and imponderables have been reviewed with the patient. Questions have been answered. All parties agreeable. ? ?

## 2022-03-17 LAB — SURGICAL PATHOLOGY

## 2022-03-18 ENCOUNTER — Encounter (HOSPITAL_COMMUNITY): Payer: Self-pay | Admitting: Internal Medicine

## 2023-02-09 IMAGING — CT CT CHEST LUNG CANCER SCREENING LOW DOSE W/O CM
2 of 3 series · 15 of 36 positions shown, 18 images · non-contrast
Comparison: No priors.

CLINICAL DATA: 65-year-old female former smoker (quit 5 years ago)
with 88 pack-year history of smoking. Lung cancer screening
examination.

EXAM:
CT CHEST WITHOUT CONTRAST LOW-DOSE FOR LUNG CANCER SCREENING
TECHNIQUE: Multidetector CT imaging of the chest was performed following the
standard protocol without IV contrast.

[Series 2: axial st · axial · 0.74mm/px · z∈[+1782,+2072]mm · 12 of 68 slices shown, 15 images]
[im 5/68  mediastinal]
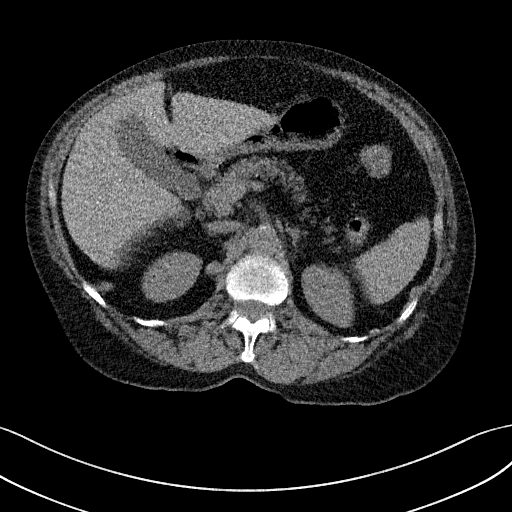
[im 5/68  lung]
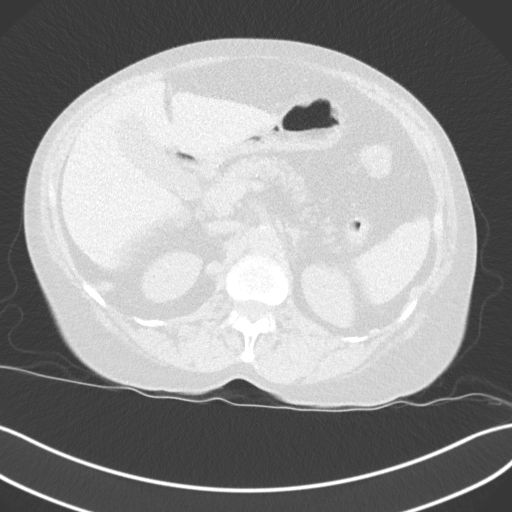
[im 10/68  lung]
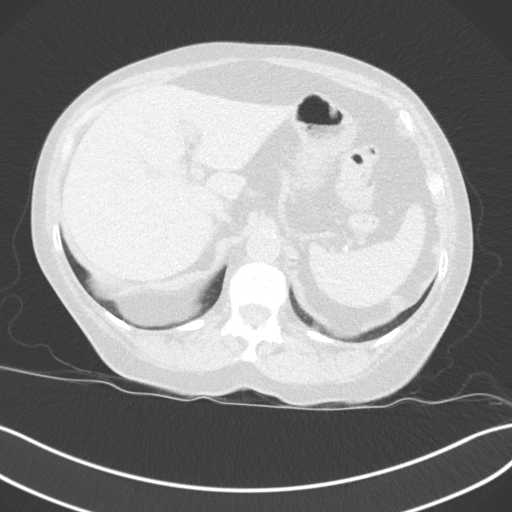
[im 15/68  lung]
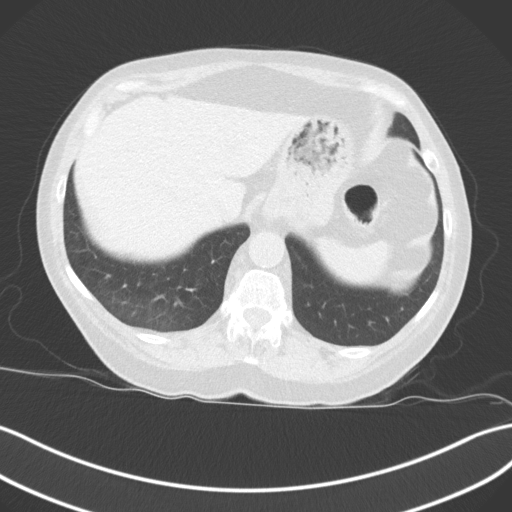
[im 20/68  lung]
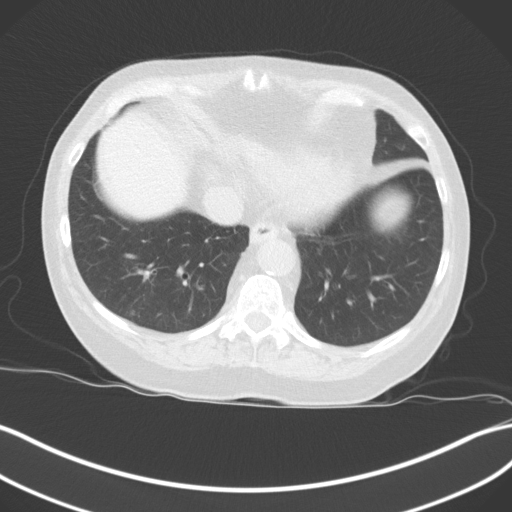
[im 25/68  mediastinal]
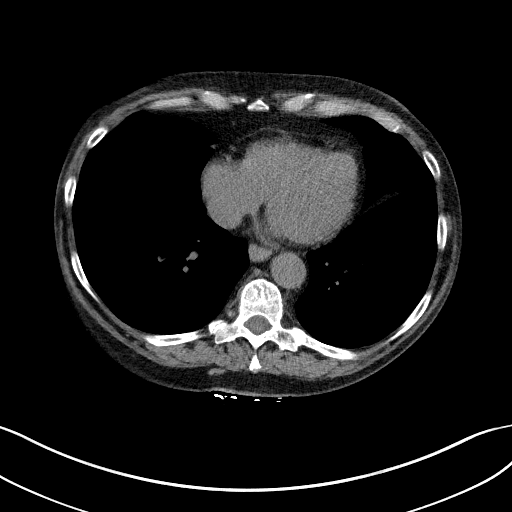
[im 25/68  lung]
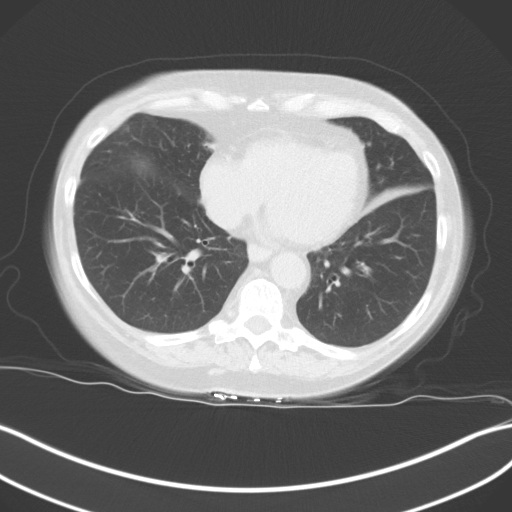
[im 30/68  lung]
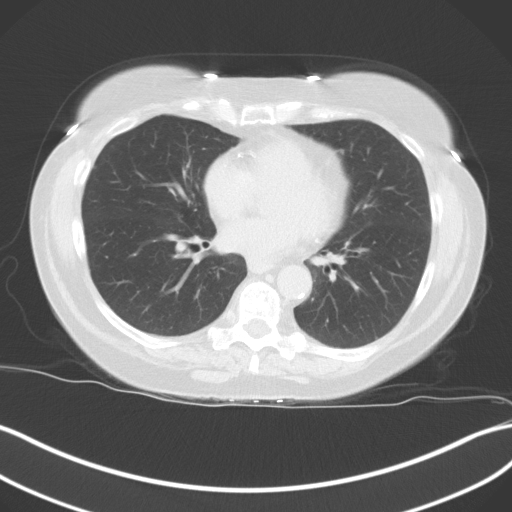
[im 38/68  lung]
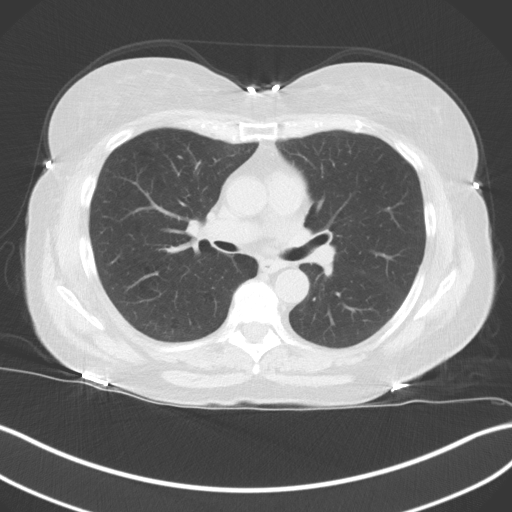
[im 43/68  lung]
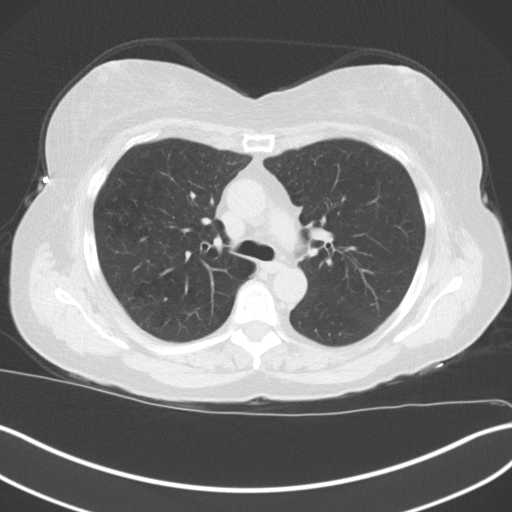
[im 48/68  mediastinal]
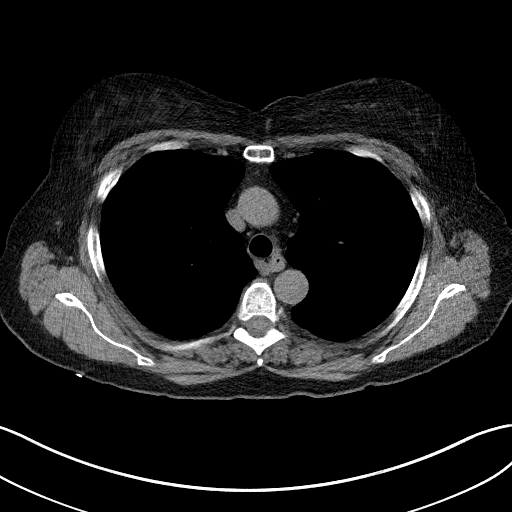
[im 48/68  lung]
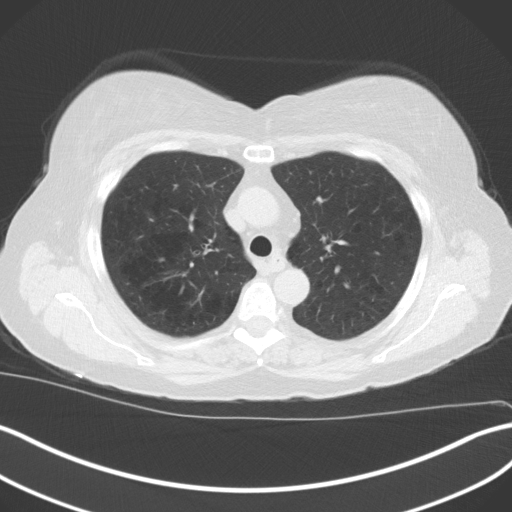
[im 53/68  lung]
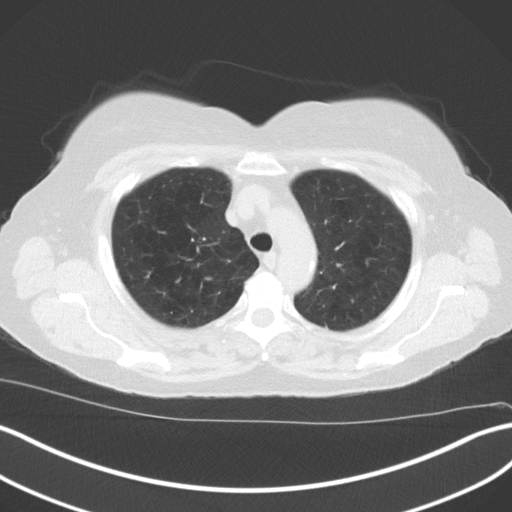
[im 58/68  lung]
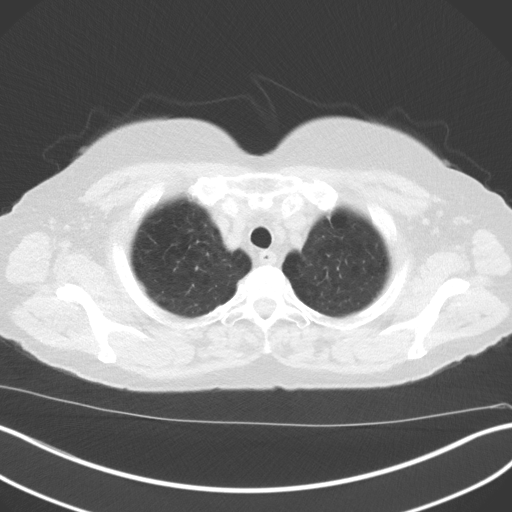
[im 63/68  lung]
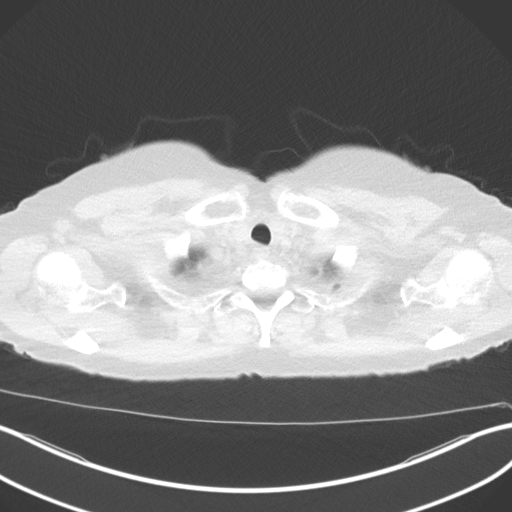

[Series 5: coronal · coronal · 0.66mm/px · 3 of 272 slices shown]
[im 55/272  lung]
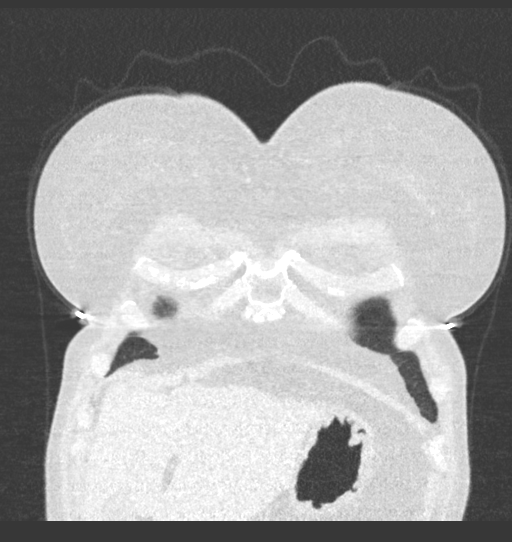
[im 109/272  lung]
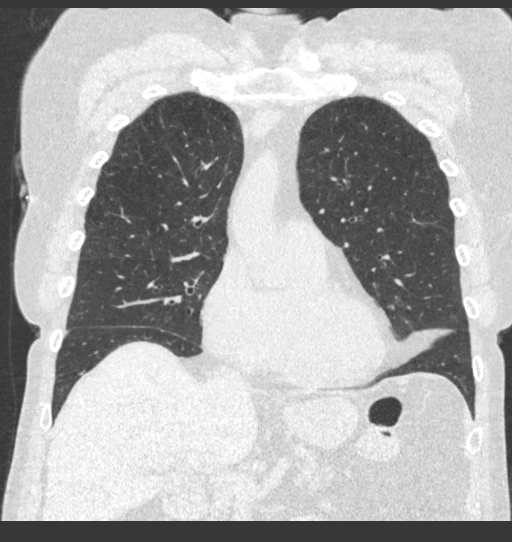
[im 163/272  lung]
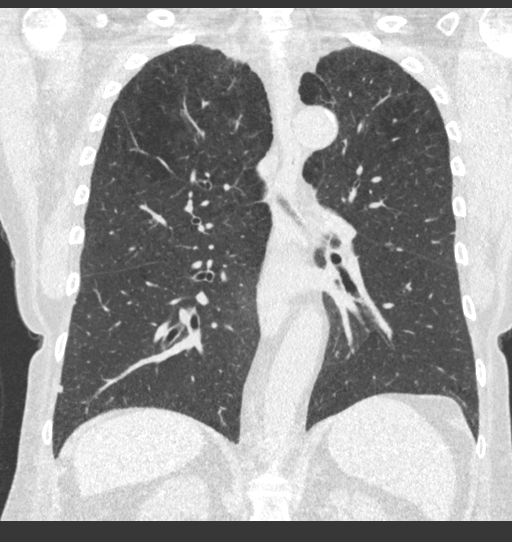

[15 of 36 positions shown; findings below may reference images not displayed]

FINDINGS: Cardiovascular: Heart size is normal. There is no significant
pericardial fluid, thickening or pericardial calcification. There is
aortic atherosclerosis, as well as atherosclerosis of the great
vessels of the mediastinum and the coronary arteries, including
calcified atherosclerotic plaque in the left main, left anterior
descending, left circumflex and right coronary arteries.

Mediastinum/Nodes: No pathologically enlarged mediastinal or hilar
lymph nodes. Please note that accurate exclusion of hilar adenopathy
is limited on noncontrast CT scans. Esophagus is unremarkable in
appearance. No axillary lymphadenopathy.

Lungs/Pleura: Multiple small pulmonary nodules are noted throughout
the lungs bilaterally, largest of which is in the periphery of the
right lower lobe (axial image 252 of series 3), with a volume
derived mean diameter 4.5 mm. No larger more suspicious appearing
pulmonary nodules or masses are noted. No acute consolidative
airspace disease. No pleural effusions. Mild diffuse bronchial wall
thickening with mild to moderate centrilobular and paraseptal
emphysema.

Upper Abdomen: Aortic atherosclerosis.

Musculoskeletal: There are no aggressive appearing lytic or blastic
lesions noted in the visualized portions of the skeleton.
IMPRESSION: 1. Lung-RADS 2S, benign appearance or behavior. Continue annual
screening with low-dose chest CT without contrast in 12 months.
2. The "S" modifier above refers to potentially clinically
significant non lung cancer related findings. Specifically, there is
aortic atherosclerosis, in addition to left main and 3 vessel
coronary artery disease. Please note that although the presence of
coronary artery calcium documents the presence of coronary artery
disease, the severity of this disease and any potential stenosis
cannot be assessed on this non-gated CT examination. Assessment for
potential risk factor modification, dietary therapy or pharmacologic
therapy may be warranted, if clinically indicated.
3. Mild diffuse bronchial wall thickening with mild to moderate
centrilobular and paraseptal emphysema; imaging findings suggestive
of underlying COPD.

Aortic Atherosclerosis (4JUPP-EQX.X) and Emphysema (4JUPP-9D7.8).

## 2023-02-12 ENCOUNTER — Emergency Department (HOSPITAL_COMMUNITY): Payer: Medicare Other

## 2023-02-12 ENCOUNTER — Encounter (HOSPITAL_COMMUNITY): Payer: Self-pay

## 2023-02-12 ENCOUNTER — Emergency Department (HOSPITAL_COMMUNITY)
Admission: EM | Admit: 2023-02-12 | Discharge: 2023-02-13 | Disposition: A | Discharge status: Home / Self Care | Payer: Medicare Other | Attending: Student | Admitting: Student

## 2023-02-12 DIAGNOSIS — S62101A Fracture of unspecified carpal bone, right wrist, initial encounter for closed fracture: Secondary | ICD-10-CM | POA: Insufficient documentation

## 2023-02-12 DIAGNOSIS — X58XXXA Exposure to other specified factors, initial encounter: Secondary | ICD-10-CM | POA: Diagnosis not present

## 2023-02-12 DIAGNOSIS — M25531 Pain in right wrist: Secondary | ICD-10-CM | POA: Diagnosis present

## 2023-02-12 MED ORDER — ACETAMINOPHEN 500 MG PO TABS
1000.0000 mg | ORAL_TABLET | Freq: Once | ORAL | Status: AC
  Administered 2023-02-12: 1000 mg via ORAL
  Filled 2023-02-12: qty 2

## 2023-02-12 NOTE — Discharge Instructions (Signed)
Please take tylenol/ibuprofen for pain. I recommend close follow-up with orthopedics for reevaluation.  Please do not hesitate to return to emergency department if worrisome signs symptoms we discussed become apparent.

## 2023-02-12 NOTE — ED Notes (Signed)
Ortho tech called for sugar tong splint

## 2023-02-12 NOTE — ED Triage Notes (Signed)
Pt states that she had a mechanical fall and hurt her L wrist, went to UC and had brace placed, UC called her and said it was broken and to come to the ER

## 2023-02-12 NOTE — ED Provider Notes (Signed)
[facility] Provider Note   CSN: [id number] Arrival date & time: 02/12/23  2024     History  Chief Complaint  Patient presents with   Wrist Pain    Anna Sawyer is a 67 y.o. female presents today for evaluation of right wrist pain.  Patient had a mechanical fall earlier today.  She has an injury to her right wrist.  She was evaluated in urgent care, got an x-ray, was told that she got fracture of her wrist.  She does have a wrist brace placed by urgent care. She denies hitting her head or LOC.   Wrist Pain     History reviewed. No pertinent past medical history. Past Surgical History:  Procedure Laterality Date   ABDOMINAL HYSTERECTOMY     BIOPSY  03/14/2022   Procedure: BIOPSY;  Surgeon: Eloise Harman, DO;  Location: [facility];  Service: Endoscopy;;   COLONOSCOPY  06/10/2021   Dr. Rocco Serene; Mild internal hemorrhoid, 8 mm sessile serrated adenoma in transverse colon resected, 8 mm sessile serrated adenoma in the splenic flexure removed, unclear if entire polyp was removed as patient appeared to vomit and procedure was terminated, due to rapidly retrieving scope from splenic flexure, left-sided polyps may have been missed, diverticulosis in left colon.   COLONOSCOPY WITH PROPOFOL N/A 03/14/2022   Procedure: COLONOSCOPY WITH PROPOFOL;  Surgeon: Eloise Harman, DO;  Location: [facility];  Service: Endoscopy;  Laterality: N/A;  10:15am, asa 2   POLYPECTOMY  03/14/2022   Procedure: POLYPECTOMY;  Surgeon: Eloise Harman, DO;  Location: [facility];  Service: Endoscopy;;     Home Medications Prior to Admission medications   Not on File      Allergies    Codeine and Sulfa antibiotics    Review of Systems   Review of Systems Negative except as per HPI.  Physical Exam Updated Vital Signs BP (!) 173/81 (BP Location: Left Arm)   Pulse 86   Temp 98.2 F (36.8 C) (Oral)   Resp 18   SpO2 95%  Physical  Exam Vitals and nursing note reviewed.  Constitutional:      Appearance: Normal appearance.  HENT:     Head: Normocephalic and atraumatic.     Mouth/Throat:     Mouth: Mucous membranes are moist.  Eyes:     General: No scleral icterus. Cardiovascular:     Rate and Rhythm: Normal rate and regular rhythm.     Pulses: Normal pulses.     Heart sounds: Normal heart sounds.  Pulmonary:     Effort: Pulmonary effort is normal.     Breath sounds: Normal breath sounds.  Abdominal:     General: Abdomen is flat.     Palpations: Abdomen is soft.     Tenderness: There is no abdominal tenderness.  Musculoskeletal:        General: No deformity.  Skin:    General: Skin is warm.     Findings: No rash.  Neurological:     General: No focal deficit present.     Mental Status: She is alert.  Psychiatric:        Mood and Affect: Mood normal.     ED Results / Procedures / Treatments   Labs (all labs ordered are listed, but only abnormal results are displayed) Labs Reviewed - No data to display  EKG None  Radiology DG Wrist Complete Right  Result Date: 02/12/2023 CLINICAL DATA:  Fall with  right wrist fracture. EXAM: RIGHT WRIST - COMPLETE 3+ VIEW COMPARISON:  None Available. FINDINGS: Mild osteopenia. There is an acute intra-articular comminuted fracture of the distal radius, with mild impaction and dorsal tilting of the distal fracture fragments, dorsal buckling and comminution at the fracture margins, and a nondisplaced oblique ulnar styloid base fracture. There is generalized soft tissue swelling at the wrist with no further evidence of fractures. Arthritic changes are not seen. IMPRESSION: 1. Acute comminuted intra-articular fracture of the distal radius with mild dorsal tilting and buckling of the distal fracture fragments. 2. Nondisplaced oblique ulnar styloid base fracture. 3. Osteopenia. Electronically Signed   By: Telford Nab M.D.   On: 02/12/2023 21:46    Procedures Procedures     Medications Ordered in ED Medications  acetaminophen (TYLENOL) tablet 1,000 mg (1,000 mg Oral Given 02/12/23 2140)    ED Course/ Medical Decision Making/ A&P                             Medical Decision Making Amount and/or Complexity of Data Reviewed Radiology: ordered.  Risk OTC drugs.   This patient presents to the ED for R wrist pain, this involves an extensive number of treatment options, and is a complaint that carries with a high risk of complications and morbidity.  The differential diagnosis includes fracture, dislocation.  This is not an exhaustive list.  Imaging studies: I ordered imaging studies. I personally reviewed, interpreted imaging and agree with the radiologist's interpretations. The results include: XR showed 1. Acute comminuted intra-articular fracture of the distal radius with mild dorsal tilting and buckling of the distal fracture fragments. 2. Nondisplaced oblique ulnar styloid base fracture. 3. Osteopenia.   Problem list/ ED course/ Critical interventions/ Medical management: HPI: See above Vital signs within normal range and stable throughout visit. Laboratory/imaging studies significant for: See above. On physical examination, patient is afebrile and appears in no acute distress.  X-ray of the wrist showed acute comminuted intra-articular fracture of the distal radius with mild dorsal tilting and buckling of the distal fracture fragments and nondisplaced oblique ulnar styloid base fracture.  Short arm splint ordered.  Ortho tech applied short arm splint at bedside.  Advised patient to take Tylenol/ibuprofen for pain, follow-up with Ortho for further evaluation management, return to ER if new or worsening symptoms. I have reviewed the patient home medicines and have made adjustments as needed.  Cardiac monitoring/EKG: The patient was maintained on a cardiac monitor.  I personally reviewed and interpreted the cardiac monitor which showed an underlying  rhythm of: sinus rhythm.  Additional history obtained: External records from outside source obtained and reviewed including: Chart review including previous notes, labs, imaging.  Consultations obtained:  Disposition Continued outpatient therapy. Follow-up with ortho recommended for reevaluation of symptoms. Treatment plan discussed with patient.  Pt acknowledged understanding was agreeable to the plan. Worrisome signs and symptoms were discussed with patient, and patient acknowledged understanding to return to the ED if they noticed these signs and symptoms. Patient was stable upon discharge.   This chart was dictated using voice recognition software.  Despite best efforts to proofread,  errors can occur which can change the documentation meaning.          Final Clinical Impression(s) / ED Diagnoses Final diagnoses:  Right wrist pain    Rx / DC Orders ED Discharge Orders     None         Rex Kras, [place]  02/12/23 2346    Kommor, Debe Coder, MD 02/13/23 1537

## 2023-02-13 NOTE — Progress Notes (Signed)
Orthopedic Tech Progress Note Patient Details:  Anna Sawyer August 25, 1956 [id number]  Patient had came in with an [place] so I applied it once I got her splinted   Ortho Devices Type of Ortho Device: Cotton web roll, Ace wrap, Sugartong splint Ortho Device/Splint Location: RUE Ortho Device/Splint Interventions: Ordered, Application, Adjustment   Post Interventions Patient Tolerated: Well Instructions Provided: Care of device  Janit Pagan 02/13/2023, 12:16 AM

## 2023-04-30 ENCOUNTER — Other Ambulatory Visit (HOSPITAL_COMMUNITY): Payer: Self-pay | Admitting: Family Medicine

## 2023-04-30 DIAGNOSIS — Z87891 Personal history of nicotine dependence: Secondary | ICD-10-CM

## 2023-04-30 DIAGNOSIS — Z122 Encounter for screening for malignant neoplasm of respiratory organs: Secondary | ICD-10-CM

## 2023-05-29 ENCOUNTER — Ambulatory Visit (HOSPITAL_COMMUNITY)
Admission: RE | Admit: 2023-05-29 | Discharge: 2023-05-29 | Disposition: A | Discharge status: Home / Self Care | Payer: Medicare Other | Source: Ambulatory Visit | Attending: Family Medicine | Admitting: Family Medicine

## 2023-05-29 DIAGNOSIS — Z87891 Personal history of nicotine dependence: Secondary | ICD-10-CM | POA: Insufficient documentation

## 2023-05-29 DIAGNOSIS — Z122 Encounter for screening for malignant neoplasm of respiratory organs: Secondary | ICD-10-CM
